# Patient Record
Sex: Female | Born: 1950 | Race: White | Hispanic: No | Marital: Married | Smoking: Never smoker
Health system: Southern US, Community
[De-identification: ages and names within clinical notes are randomized; demographics above are authoritative.]

## PROBLEM LIST (undated history)

## (undated) DIAGNOSIS — E785 Hyperlipidemia, unspecified: Secondary | ICD-10-CM

## (undated) DIAGNOSIS — Z79899 Other long term (current) drug therapy: Secondary | ICD-10-CM

## (undated) HISTORY — DX: Other long term (current) drug therapy: Z79.899

## (undated) HISTORY — DX: Hyperlipidemia, unspecified: E78.5

---

## 1980-12-12 HISTORY — PX: ABDOMINAL HYSTERECTOMY: SHX81

## 2001-06-06 ENCOUNTER — Encounter: Payer: Self-pay | Admitting: *Deleted

## 2001-06-06 ENCOUNTER — Ambulatory Visit (HOSPITAL_COMMUNITY): Admission: RE | Admit: 2001-06-06 | Discharge: 2001-06-06 | Payer: Self-pay | Admitting: *Deleted

## 2002-06-10 ENCOUNTER — Encounter: Payer: Self-pay | Admitting: Family Medicine

## 2002-06-10 ENCOUNTER — Ambulatory Visit (HOSPITAL_COMMUNITY): Admission: RE | Admit: 2002-06-10 | Discharge: 2002-06-10 | Payer: Self-pay | Admitting: Family Medicine

## 2003-06-11 ENCOUNTER — Encounter: Payer: Self-pay | Admitting: Family Medicine

## 2003-06-11 ENCOUNTER — Ambulatory Visit (HOSPITAL_COMMUNITY): Admission: RE | Admit: 2003-06-11 | Discharge: 2003-06-11 | Payer: Self-pay | Admitting: Family Medicine

## 2004-06-22 ENCOUNTER — Ambulatory Visit (HOSPITAL_COMMUNITY): Admission: RE | Admit: 2004-06-22 | Discharge: 2004-06-22 | Payer: Self-pay | Admitting: Family Medicine

## 2005-06-23 ENCOUNTER — Ambulatory Visit (HOSPITAL_COMMUNITY): Admission: RE | Admit: 2005-06-23 | Discharge: 2005-06-23 | Payer: Self-pay | Admitting: Family Medicine

## 2006-07-31 ENCOUNTER — Ambulatory Visit (HOSPITAL_COMMUNITY): Admission: RE | Admit: 2006-07-31 | Discharge: 2006-07-31 | Payer: Self-pay | Admitting: Family Medicine

## 2007-02-22 ENCOUNTER — Ambulatory Visit (HOSPITAL_COMMUNITY): Admission: RE | Admit: 2007-02-22 | Discharge: 2007-02-22 | Payer: Self-pay | Admitting: Internal Medicine

## 2007-08-06 ENCOUNTER — Ambulatory Visit (HOSPITAL_COMMUNITY): Admission: RE | Admit: 2007-08-06 | Discharge: 2007-08-06 | Payer: Self-pay | Admitting: Family Medicine

## 2007-08-06 ENCOUNTER — Ambulatory Visit (HOSPITAL_COMMUNITY): Admission: RE | Admit: 2007-08-06 | Discharge: 2007-08-06 | Payer: Self-pay | Admitting: Internal Medicine

## 2007-08-22 ENCOUNTER — Ambulatory Visit (HOSPITAL_COMMUNITY): Admission: RE | Admit: 2007-08-22 | Discharge: 2007-08-22 | Payer: Self-pay | Admitting: Internal Medicine

## 2008-08-27 ENCOUNTER — Ambulatory Visit (HOSPITAL_COMMUNITY): Admission: RE | Admit: 2008-08-27 | Discharge: 2008-08-27 | Payer: Self-pay | Admitting: Internal Medicine

## 2009-08-31 ENCOUNTER — Ambulatory Visit (HOSPITAL_COMMUNITY): Admission: RE | Admit: 2009-08-31 | Discharge: 2009-08-31 | Payer: Self-pay | Admitting: Internal Medicine

## 2009-09-03 ENCOUNTER — Ambulatory Visit (HOSPITAL_COMMUNITY): Admission: RE | Admit: 2009-09-03 | Discharge: 2009-09-03 | Payer: Self-pay | Admitting: Internal Medicine

## 2010-09-02 ENCOUNTER — Ambulatory Visit (HOSPITAL_COMMUNITY): Admission: RE | Admit: 2010-09-02 | Discharge: 2010-09-02 | Payer: Self-pay | Admitting: Internal Medicine

## 2011-07-25 ENCOUNTER — Other Ambulatory Visit (HOSPITAL_COMMUNITY): Payer: Self-pay | Admitting: Internal Medicine

## 2011-07-25 DIAGNOSIS — Z139 Encounter for screening, unspecified: Secondary | ICD-10-CM

## 2011-09-05 ENCOUNTER — Ambulatory Visit (HOSPITAL_COMMUNITY)
Admission: RE | Admit: 2011-09-05 | Discharge: 2011-09-05 | Disposition: A | Discharge status: Home / Self Care | Payer: BC Managed Care – PPO | Source: Ambulatory Visit | Attending: Internal Medicine | Admitting: Internal Medicine

## 2011-09-05 DIAGNOSIS — Z139 Encounter for screening, unspecified: Secondary | ICD-10-CM

## 2011-09-05 DIAGNOSIS — Z1231 Encounter for screening mammogram for malignant neoplasm of breast: Secondary | ICD-10-CM | POA: Insufficient documentation

## 2012-08-07 ENCOUNTER — Other Ambulatory Visit (HOSPITAL_COMMUNITY): Payer: Self-pay | Admitting: Internal Medicine

## 2012-08-07 DIAGNOSIS — Z139 Encounter for screening, unspecified: Secondary | ICD-10-CM

## 2012-09-10 ENCOUNTER — Ambulatory Visit (HOSPITAL_COMMUNITY)
Admission: RE | Admit: 2012-09-10 | Discharge: 2012-09-10 | Disposition: A | Discharge status: Home / Self Care | Payer: BC Managed Care – PPO | Source: Ambulatory Visit | Attending: Internal Medicine | Admitting: Internal Medicine

## 2012-09-10 DIAGNOSIS — Z1231 Encounter for screening mammogram for malignant neoplasm of breast: Secondary | ICD-10-CM | POA: Insufficient documentation

## 2012-09-10 DIAGNOSIS — Z139 Encounter for screening, unspecified: Secondary | ICD-10-CM

## 2013-04-12 ENCOUNTER — Other Ambulatory Visit: Payer: Self-pay | Admitting: Adult Health

## 2013-04-12 MED ORDER — ESTRADIOL 0.1 MG/GM VA CREA: 2.0000 g | TOPICAL_CREAM | Freq: Every day | VAGINAL | Status: DC

## 2013-09-12 ENCOUNTER — Other Ambulatory Visit (HOSPITAL_COMMUNITY): Payer: Self-pay | Admitting: Internal Medicine

## 2013-09-12 DIAGNOSIS — Z139 Encounter for screening, unspecified: Secondary | ICD-10-CM

## 2013-09-19 ENCOUNTER — Ambulatory Visit (HOSPITAL_COMMUNITY)
Admission: RE | Admit: 2013-09-19 | Discharge: 2013-09-19 | Disposition: A | Discharge status: Home / Self Care | Payer: BC Managed Care – PPO | Source: Ambulatory Visit | Attending: Internal Medicine | Admitting: Internal Medicine

## 2013-09-19 DIAGNOSIS — Z139 Encounter for screening, unspecified: Secondary | ICD-10-CM

## 2013-09-19 DIAGNOSIS — Z1231 Encounter for screening mammogram for malignant neoplasm of breast: Secondary | ICD-10-CM | POA: Insufficient documentation

## 2014-06-04 ENCOUNTER — Telehealth: Payer: Self-pay | Admitting: Adult Health

## 2014-06-04 MED ORDER — ESTRADIOL 0.1 MG/GM VA CREA: 2.0000 g | TOPICAL_CREAM | Freq: Every day | VAGINAL | Status: DC

## 2014-06-04 NOTE — Telephone Encounter (Signed)
Will refill x 1 apt in september

## 2014-08-19 ENCOUNTER — Other Ambulatory Visit (HOSPITAL_COMMUNITY): Payer: Self-pay | Admitting: Internal Medicine

## 2014-08-19 DIAGNOSIS — Z1231 Encounter for screening mammogram for malignant neoplasm of breast: Secondary | ICD-10-CM

## 2014-08-26 ENCOUNTER — Encounter: Payer: Self-pay | Admitting: Adult Health

## 2014-08-26 ENCOUNTER — Ambulatory Visit (INDEPENDENT_AMBULATORY_CARE_PROVIDER_SITE_OTHER): Payer: Medicare Other | Admitting: Adult Health

## 2014-08-26 VITALS — BP 138/78 | HR 72 | Ht 63.25 in | Wt 116.0 lb

## 2014-08-26 DIAGNOSIS — Z79899 Other long term (current) drug therapy: Secondary | ICD-10-CM

## 2014-08-26 DIAGNOSIS — Z79818 Long term (current) use of other agents affecting estrogen receptors and estrogen levels: Secondary | ICD-10-CM

## 2014-08-26 DIAGNOSIS — Z1212 Encounter for screening for malignant neoplasm of rectum: Secondary | ICD-10-CM

## 2014-08-26 DIAGNOSIS — Z01419 Encounter for gynecological examination (general) (routine) without abnormal findings: Secondary | ICD-10-CM

## 2014-08-26 HISTORY — DX: Other long term (current) drug therapy: Z79.899

## 2014-08-26 LAB — HEMOCCULT GUIAC POC 1CARD (OFFICE): FECAL OCCULT BLD: NEGATIVE

## 2014-08-26 MED ORDER — ESTRADIOL 0.1 MG/GM VA CREA: TOPICAL_CREAM | VAGINAL | Status: DC

## 2014-08-26 NOTE — Progress Notes (Signed)
Patient ID: Miranda Simmons, female   DOB: 04/09/1951, 63 y.o.   MRN: [id number] History of Present Illness: Miranda Simmons is a 63 year old white female, married in for a gyn physical,no complaints,happy with estrace cream and luvena.She is sp hysterectomy.   Current Medications, Allergies, Past Medical History, Past Surgical History, Family History and Social History were reviewed in [facility] record.     Review of Systems: Patient denies any headaches, blurred vision, shortness of breath, chest pain, abdominal pain, problems with bowel movements, urination, or intercourse. No joint pain or mood swings.    Physical Exam:BP 138/78  Pulse 72  Ht 5' 3.25" (1.607 m)  Wt 116 lb (52.617 kg)  BMI 20.37 kg/m2 General:  Well developed, well nourished, no acute distress Skin:  Warm and dry Neck:  Midline trachea, normal thyroid Lungs; Clear to auscultation bilaterally Breast:  No dominant palpable mass, retraction, or nipple discharge Cardiovascular: Regular rate and rhythm Abdomen:  Soft, non tender, no hepatosplenomegaly Pelvic:  External genitalia is normal in appearance.  The vagina has decrease color, moisture and rugae. The cervix and uterus are absent. No  adnexal masses or tenderness noted. Rectal: Good sphincter tone, no polyps, or hemorrhoids felt.  Hemoccult negative. Extremities:  No swelling or varicosities noted Psych:  No mood changes, alert and cooperative,seems happy   Impression: Yearly gyn exam no pap Estrogen therapy    Plan: Refilled estrace cream Physical in 2 year Mammogram yearly  Colonoscopy per GI  Labs with PCP Get flu shot

## 2014-08-26 NOTE — Patient Instructions (Signed)
Physical in 1 year Mammogram yearly Colonoscopy per GI Labs with PCP  Get flu shot

## 2014-09-04 ENCOUNTER — Telehealth: Payer: Self-pay | Admitting: Adult Health

## 2014-09-04 NOTE — Telephone Encounter (Signed)
Left message letting pt know it's listed in her chart that she has never smoked. JSY

## 2014-09-24 ENCOUNTER — Ambulatory Visit (HOSPITAL_COMMUNITY)
Admission: RE | Admit: 2014-09-24 | Discharge: 2014-09-24 | Disposition: A | Discharge status: Home / Self Care | Payer: Medicare Other | Source: Ambulatory Visit | Attending: Internal Medicine | Admitting: Internal Medicine

## 2014-09-24 DIAGNOSIS — Z1231 Encounter for screening mammogram for malignant neoplasm of breast: Secondary | ICD-10-CM | POA: Diagnosis not present

## 2014-10-13 ENCOUNTER — Encounter: Payer: Self-pay | Admitting: Adult Health

## 2015-05-29 ENCOUNTER — Other Ambulatory Visit: Payer: Self-pay | Admitting: Adult Health

## 2015-08-27 ENCOUNTER — Other Ambulatory Visit (HOSPITAL_COMMUNITY): Payer: Self-pay | Admitting: Internal Medicine

## 2015-08-27 DIAGNOSIS — Z1231 Encounter for screening mammogram for malignant neoplasm of breast: Secondary | ICD-10-CM

## 2015-09-28 ENCOUNTER — Ambulatory Visit (HOSPITAL_COMMUNITY)
Admission: RE | Admit: 2015-09-28 | Discharge: 2015-09-28 | Disposition: A | Discharge status: Home / Self Care | Payer: Medicare Other | Source: Ambulatory Visit | Attending: Internal Medicine | Admitting: Internal Medicine

## 2015-09-28 DIAGNOSIS — Z1231 Encounter for screening mammogram for malignant neoplasm of breast: Secondary | ICD-10-CM | POA: Diagnosis not present

## 2016-06-27 ENCOUNTER — Other Ambulatory Visit: Payer: Self-pay | Admitting: Adult Health

## 2016-09-05 ENCOUNTER — Ambulatory Visit (INDEPENDENT_AMBULATORY_CARE_PROVIDER_SITE_OTHER): Payer: Medicare Other | Admitting: Adult Health

## 2016-09-05 ENCOUNTER — Encounter: Payer: Self-pay | Admitting: Adult Health

## 2016-09-05 ENCOUNTER — Other Ambulatory Visit (HOSPITAL_COMMUNITY): Payer: Self-pay | Admitting: Internal Medicine

## 2016-09-05 VITALS — BP 130/70 | HR 66 | Ht 63.0 in | Wt 113.0 lb

## 2016-09-05 DIAGNOSIS — Z1212 Encounter for screening for malignant neoplasm of rectum: Secondary | ICD-10-CM | POA: Diagnosis not present

## 2016-09-05 DIAGNOSIS — Z01419 Encounter for gynecological examination (general) (routine) without abnormal findings: Secondary | ICD-10-CM | POA: Diagnosis not present

## 2016-09-05 DIAGNOSIS — N5089 Other specified disorders of the male genital organs: Secondary | ICD-10-CM | POA: Insufficient documentation

## 2016-09-05 DIAGNOSIS — N952 Postmenopausal atrophic vaginitis: Secondary | ICD-10-CM | POA: Insufficient documentation

## 2016-09-05 DIAGNOSIS — Z1231 Encounter for screening mammogram for malignant neoplasm of breast: Secondary | ICD-10-CM

## 2016-09-05 LAB — HEMOCCULT GUIAC POC 1CARD (OFFICE): Fecal Occult Blood, POC: NEGATIVE

## 2016-09-05 MED ORDER — ESTRADIOL 0.1 MG/GM VA CREA: TOPICAL_CREAM | VAGINAL | 1 refills | Status: DC

## 2016-09-05 NOTE — Progress Notes (Signed)
Patient ID: Miranda Simmons, female   DOB: [birth year], 65 y.o.   MRN: [id number] History of Present Illness: Miranda Simmons is a 65 year old white female, married in for well woman gyn exam, she is sp hysterectomy and works out 4-5 times weekly at least, she is retired from [employer].She needs refills on estrace cream.  PCP is Z. Hall.   Current Medications, Allergies, Past Medical History, Past Surgical History, Family History and Social History were reviewed in [facility] electronic medical record.     Review of Systems: Patient denies any headaches, hearing loss, fatigue, blurred vision, shortness of breath, chest pain, abdominal pain, problems with bowel movements, urination, or intercourse. No joint pain or mood swings.    Physical Exam:BP 130/70 (BP Location: Left Arm, Patient Position: Sitting, Cuff Size: Normal)   Pulse 66   Ht 5\' 3"  (1.6 m)   Wt 113 lb (51.3 kg)   BMI 20.02 kg/m  General:  Well developed, well nourished, no acute distress Skin:  Warm and dry,tan Neck:  Midline trachea, normal thyroid, good ROM, no lymphadenopathy,no carotid bruits heard Lungs; Clear to auscultation bilaterally Breast:  No dominant palpable mass, retraction, or nipple discharge Cardiovascular: Regular rate and rhythm Abdomen:  Soft, non tender, no hepatosplenomegaly Pelvic:  External genitalia is normal in appearance, no lesions.  The vagina is pale and dry with loss of rugae. Urethra has no lesions or masses. The cervix and uterus are absent. No adnexal masses or tenderness noted.Bladder is non tender, no masses felt. Rectal: Good sphincter tone, no polyps, or hemorrhoids felt.  Hemoccult negative. Extremities/musculoskeletal:  No swelling or varicosities noted, no clubbing or cyanosis Psych:  No mood changes, alert and cooperative,seems happy PHQ 2 score 0.  Impression:  1. Well woman exam with routine gynecological exam   2. Vaginal atrophy      Plan: Refilled estrace cream use  2 gms 3 x weekly with 1 refill, 3 sample tubes given (36 gm) lot [id number] exp 1/20 and discount given .  Physical in 1 year Mammogram yearly Colonoscopy per GI Labs with PCP  Get flu shot in October

## 2016-09-05 NOTE — Patient Instructions (Signed)
Physical in 2 years  Mammogram yearly Colonoscopy per GI Labs with PCP  Get flu shot in October

## 2016-09-30 ENCOUNTER — Ambulatory Visit (HOSPITAL_COMMUNITY)
Admission: RE | Admit: 2016-09-30 | Discharge: 2016-09-30 | Disposition: A | Discharge status: Home / Self Care | Payer: Medicare Other | Source: Ambulatory Visit | Attending: Internal Medicine | Admitting: Internal Medicine

## 2016-09-30 DIAGNOSIS — Z1231 Encounter for screening mammogram for malignant neoplasm of breast: Secondary | ICD-10-CM | POA: Insufficient documentation

## 2017-08-28 ENCOUNTER — Other Ambulatory Visit (HOSPITAL_COMMUNITY): Payer: Self-pay | Admitting: Internal Medicine

## 2017-08-28 DIAGNOSIS — Z1231 Encounter for screening mammogram for malignant neoplasm of breast: Secondary | ICD-10-CM

## 2017-08-29 ENCOUNTER — Other Ambulatory Visit (HOSPITAL_COMMUNITY): Payer: Self-pay | Admitting: Internal Medicine

## 2017-08-29 DIAGNOSIS — Z780 Asymptomatic menopausal state: Secondary | ICD-10-CM

## 2017-10-02 ENCOUNTER — Ambulatory Visit (HOSPITAL_COMMUNITY)
Admission: RE | Admit: 2017-10-02 | Discharge: 2017-10-02 | Disposition: A | Discharge status: Home / Self Care | Payer: Medicare Other | Source: Ambulatory Visit | Attending: Internal Medicine | Admitting: Internal Medicine

## 2017-10-02 DIAGNOSIS — M85851 Other specified disorders of bone density and structure, right thigh: Secondary | ICD-10-CM | POA: Insufficient documentation

## 2017-10-02 DIAGNOSIS — Z780 Asymptomatic menopausal state: Secondary | ICD-10-CM | POA: Diagnosis present

## 2017-10-02 DIAGNOSIS — Z1231 Encounter for screening mammogram for malignant neoplasm of breast: Secondary | ICD-10-CM | POA: Insufficient documentation

## 2018-09-06 ENCOUNTER — Ambulatory Visit: Payer: Medicare Other | Admitting: Adult Health

## 2018-09-06 ENCOUNTER — Other Ambulatory Visit: Payer: Self-pay

## 2018-09-06 ENCOUNTER — Encounter: Payer: Self-pay | Admitting: Adult Health

## 2018-09-06 VITALS — BP 156/87 | HR 51 | Ht 63.0 in | Wt 110.0 lb

## 2018-09-06 DIAGNOSIS — Z1211 Encounter for screening for malignant neoplasm of colon: Secondary | ICD-10-CM | POA: Insufficient documentation

## 2018-09-06 DIAGNOSIS — N952 Postmenopausal atrophic vaginitis: Secondary | ICD-10-CM

## 2018-09-06 DIAGNOSIS — Z1212 Encounter for screening for malignant neoplasm of rectum: Secondary | ICD-10-CM

## 2018-09-06 DIAGNOSIS — R030 Elevated blood-pressure reading, without diagnosis of hypertension: Secondary | ICD-10-CM

## 2018-09-06 DIAGNOSIS — Z01419 Encounter for gynecological examination (general) (routine) without abnormal findings: Secondary | ICD-10-CM | POA: Diagnosis not present

## 2018-09-06 LAB — HEMOCCULT GUIAC POC 1CARD (OFFICE): FECAL OCCULT BLD: NEGATIVE

## 2018-09-06 MED ORDER — ESTRADIOL 4 MCG VA INST: 1.0000 | VAGINAL_INSERT | Freq: Every day | VAGINAL | 0 refills | Status: AC

## 2018-09-06 MED ORDER — HYDROCHLOROTHIAZIDE 12.5 MG PO CAPS: 12.5000 mg | ORAL_CAPSULE | Freq: Every day | ORAL | 6 refills | Status: DC

## 2018-09-06 NOTE — Progress Notes (Signed)
Patient ID: Miranda Simmons, female   DOB: Sep 08, 1951, 67 y.o.   MRN: [id number] History of Present Illness: Miranda Simmons is a 67 year old white female, married, sp hysterectomy in for well woman gyn exam.She and husband moved to [place].And his health is not good. PCP is Dr Marybelle Killings.   Current Medications, Allergies, Past Medical History, Past Surgical History, Family History and Social History were reviewed in [employer] [facility] record.     Review of Systems:  Patient denies any headaches, hearing loss, fatigue, blurred vision, shortness of breath, chest pain, abdominal pain, problems with bowel movements, urination, or intercourse(sex hurts is dry, stopped estrace cream, was too expensive) No joint pain or mood swings. Sleeping Ok.  Physical Exam:BP (!) 156/87   Pulse (!) 51   Ht 5\' 3"  (1.6 m)   Wt 110 lb (49.9 kg)   BMI 19.49 kg/m  General:  Well developed, well nourished, no acute distress Skin:  Warm and dry Neck:  Midline trachea, normal thyroid, good ROM, no lymphadenopathy,no cartoid bruits heard Lungs; Clear to auscultation bilaterally Breast:  No dominant palpable mass, retraction, or nipple discharge Cardiovascular: Regular rate and rhythm Abdomen:  Soft, non tender, no hepatosplenomegaly Pelvic:  External genitalia is normal in appearance, no lesions.  The vagina is pale and dry, very atrophic. Urethra has no lesions. The cervix and uterus are absent. No adnexal masses or tenderness noted.Bladder is non tender, no masses felt. Rectal: Good sphincter tone, no polyps, or hemorrhoids felt.  Hemoccult negative. Extremities/musculoskeletal:  No swelling or varicosities noted, no clubbing or cyanosis Psych:  No mood changes, alert and cooperative,seems happy, but stressed over husband's health. PHQ 2 score 0. Examination chaperoned by Marchelle Folks Rash, LPN.  She just saw Dr Marybelle Killings Monday and had labs and BP was elevated at his office and she has taken at home  and has had some elevated numbers.  Will try imvexxy for vaginal moisture and wil put on microzide for BP.   Impression: 1. Well woman exam with routine gynecological exam   2. Vaginal atrophy   3. Screening for colorectal cancer   4. Elevated BP without diagnosis of hypertension       Plan: Given 20 tabs of imvexxy to use 1 in vagina daily for 2 weeks then 2 x weekly Use astroglide with sex Rx Microzide 12.5 mg 1 daily #30 with 6 refills Keep check of BP at home and let me know results in 2 weeks  F/U with PCP, and talk with him about labs Mammogram yearly

## 2018-09-20 ENCOUNTER — Telehealth: Payer: Self-pay | Admitting: *Deleted

## 2018-09-20 NOTE — Telephone Encounter (Signed)
Pt called back stating her pharmacy didn't have Imvexxy for pt. I looked in JAG's note and called pharmacy and gave a verbal for Imvexxy using 2x weekly #18 with no refills. JSY

## 2018-09-20 NOTE — Telephone Encounter (Signed)
Spoke with pt letting her know BP's are good and to stay on BP med per Selena Batten, CNM and Imvexxy was sent to pharmacy on 09/06/18, same day as BP med. Pt voiced understanding. JSY

## 2019-02-26 ENCOUNTER — Other Ambulatory Visit: Payer: Self-pay | Admitting: Adult Health

## 2019-03-04 ENCOUNTER — Telehealth: Payer: Self-pay | Admitting: Adult Health

## 2019-03-04 NOTE — Telephone Encounter (Signed)
I had called pt to let her know to continue BP meds has refills

## 2019-03-04 NOTE — Telephone Encounter (Signed)
Patient called, she stated Miranda Simmons was going to call in a prescription for her for her bp.  She stated that bottle said one refill by March, she got it filled and the new bottle said she can have six refills by March 2021.  She wanted to let you know how much she appreciated your promptness.   [phone]

## 2019-04-07 ENCOUNTER — Encounter: Payer: Self-pay | Admitting: Adult Health

## 2019-09-23 ENCOUNTER — Other Ambulatory Visit: Payer: Self-pay | Admitting: Adult Health
